# Patient Record
Sex: Male | Born: 1995 | Race: White | Hispanic: No | Marital: Married | Smoking: Current every day smoker
Health system: Southern US, Community
[De-identification: ages and names within clinical notes are randomized; demographics above are authoritative.]

## PROBLEM LIST (undated history)

## (undated) DIAGNOSIS — F909 Attention-deficit hyperactivity disorder, unspecified type: Secondary | ICD-10-CM

## (undated) DIAGNOSIS — T148XXA Other injury of unspecified body region, initial encounter: Secondary | ICD-10-CM

## (undated) HISTORY — PX: TYMPANOSTOMY: SHX2586

---
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
## 2018-05-07 ENCOUNTER — Ambulatory Visit: Payer: Self-pay | Admitting: Physician Assistant

## 2018-05-07 ENCOUNTER — Encounter: Payer: Self-pay | Admitting: Physician Assistant

## 2018-05-07 VITALS — BP 118/84 | HR 50 | Temp 97.9°F | Resp 18 | Ht 67.0 in | Wt 143.0 lb

## 2018-05-07 DIAGNOSIS — Z0000 Encounter for general adult medical examination without abnormal findings: Secondary | ICD-10-CM

## 2018-05-07 DIAGNOSIS — Z23 Encounter for immunization: Secondary | ICD-10-CM

## 2018-05-07 DIAGNOSIS — Z111 Encounter for screening for respiratory tuberculosis: Secondary | ICD-10-CM

## 2018-05-07 NOTE — Progress Notes (Addendum)
Patient ID: Shakim Kalra MRN: [id number], DOB: [birth year], 22 y.o. Date of Encounter: @DATE @  Chief Complaint:  Chief Complaint  Patient presents with  . Annual Exam  . need flu vaccine  . need Tb gold    HPI: 22 y.o. year old male  presents with above.    He reports that he is currently doing online classes and CNA classes through [employer]. 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  His grandfather was in the [employer] and names off another family member who is a Press photographer at the hospital.  Therefore he says these are contributing reasons that he has decided this is what he wants to do. He has a form with him regarding immunizations.  Immunizations are up-to-date other than getting flu vaccine which he will get today.  He also needs TB test.  I reviewed with him that he was on ADHD meds in the past.  He states that he has not been on these medications for several years.  States that he is on absolutely no medications now.  He has had no other known medical history.  No significant past history.  No other concerns to address today.    Past Medical History:  Diagnosis Date  . 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 Fracture leg, right radial     Home Meds: No outpatient medications prior to visit.   No facility-administered medications prior to visit.     Allergies: No Known Allergies  Social History   Socioeconomic History  . Marital status: Married    Spouse name: Not on file  . Number of children: Not on file  . Years of education: Not on file  . Highest education level: Not on file  Occupational History  . Not on file  Social Needs  . Financial resource strain: Not on file  . Food insecurity:    Worry: Not on file    Inability: Not on file  . Transportation needs:    Medical: Not on file    Non-medical: Not on file  Tobacco Use  . Smoking status: Current  Every Day Smoker    Types: E-cigarettes  . Smokeless tobacco: Former Engineer, water and Sexual Activity  . Alcohol use: Yes    Alcohol/week: 4.0 standard drinks    Types: 4 Cans of beer per week  . Drug use: No  . Sexual activity: Yes  Lifestyle  . Physical activity:    Days per week: Not on file    Minutes per session: Not on file  . Stress: Not on file  Relationships  . Social connections:    Talks on phone: Not on file    Gets together: Not on file    Attends religious service: Not on file    Active member of club or organization: Not on file    Attends meetings of clubs or organizations: Not on file    Relationship status: Not on file  . Intimate partner violence:    Fear of current or ex partner: Not on file    Emotionally abused: Not on file    Physically abused: Not on file    Forced sexual activity: Not on file  Other Topics Concern  . Not on file  Social History Narrative  . Not on file    History reviewed. No pertinent family history.   Review of Systems:  See HPI for pertinent ROS. All other ROS negative.    Physical Exam: Blood pressure 118/84, pulse (!) 50, temperature 97.9 F (36.6 C), temperature source Oral, resp. rate 18, height 5\' 7"  (1.702 m), weight 64.9 kg, SpO2 98 %., Body mass index is 22.4 kg/m. General:  WNWD WM Appears in no acute distress. Head: Normocephalic, atraumatic, eyes without discharge, sclera non-icteric, nares are without discharge. Bilateral auditory canals clear, TM's are without perforation, pearly grey and translucent with reflective cone of light bilaterally. Oral cavity moist, posterior pharynx without exudate, erythema.  Neck: Supple. No thyromegaly. No lymphadenopathy. Lungs: Clear bilaterally to auscultation without wheezes, rales, or rhonchi. Breathing is unlabored. Heart: RRR with S1 S2. No murmurs, rubs, or gallops. Abdomen: Soft, non-tender, non-distended with normoactive bowel sounds. No hepatomegaly. No  rebound/guarding. No obvious abdominal masses. Musculoskeletal:  Strength and tone normal for age. Extremities/Skin: Warm and dry.  No rashes or suspicious lesions. Neuro: Alert and oriented X 3. Moves all extremities spontaneously. Gait is normal. CNII-XII grossly in tact. Psych:  Responds to questions appropriately with a normal affect.     ASSESSMENT AND PLAN:  22 y.o. year old male with   1. Encounter for preventive health examination Hearing screening normal Vision screen abnormal. He reports he is aware of poor visual acuity.  Reports that his glasses have been broken and that he has not been able to afford updated eye exam, contacts/glasses.  I have discussed importance of f/u regarding this. He voices understanding. Growth Chart normal.  25th to 50th percentile for weight.  10th percentile for height. Influenza vaccine today.  Remainder of immunizations are up-to-date. Will check TB test for screening for TB.  2. Screening for tuberculosis  - QuantiFERON-TB Gold Plus   Signed, [address] [place], [place], [facility] 05/07/2018 10:57 AM

## 2018-05-10 LAB — QUANTIFERON-TB GOLD PLUS
NIL: 0.06 IU/mL
QuantiFERON-TB Gold Plus: NEGATIVE
TB1-NIL: 0 [IU]/mL
TB2-NIL: 0.01 IU/mL

## 2018-10-13 ENCOUNTER — Encounter (HOSPITAL_COMMUNITY): Payer: Self-pay

## 2018-10-13 ENCOUNTER — Ambulatory Visit (HOSPITAL_COMMUNITY)
Admission: EM | Admit: 2018-10-13 | Discharge: 2018-10-13 | Disposition: A | Discharge status: Home / Self Care | Payer: Self-pay | Attending: Family Medicine | Admitting: Family Medicine

## 2018-10-13 DIAGNOSIS — H1031 Unspecified acute conjunctivitis, right eye: Secondary | ICD-10-CM

## 2018-10-13 MED ORDER — POLYMYXIN B-TRIMETHOPRIM 10000-0.1 UNIT/ML-% OP SOLN: 1.0000 [drp] | OPHTHALMIC | 0 refills | Status: AC

## 2018-10-13 NOTE — ED Triage Notes (Signed)
Pt presents with right eye irritation, swelling, and redness since yesterday.

## 2018-10-13 NOTE — Discharge Instructions (Addendum)
We will go ahead and treat you for pick eye.  1 drop in the right eye every 4 hours while awake.  If your symptoms worsen despite treatment with more severe swelling to the eye and pain you will need to be seen again.

## 2018-10-13 NOTE — ED Provider Notes (Addendum)
[facility]    CSN: [id number] Arrival date & time: 10/13/18  1310     History   Chief Complaint Chief Complaint  Patient presents with  . Conjunctivitis    Right    HPI John Oliver is a 23 y.o. male.   Pt is a 23 year old male that presents with right eye redness, irritation, and lid swelling since yesterday. Symptoms have been constant and he has been using clear eyes with some relief in the dryness.  Some drainage.  He also has fever here today. Denies any recent sick contact. Denies any pain with the eye or light sensitivity. Denies nasal congestion, cough, rhinorrhea. Denies sinus tenderness. No injuries or foreign bodies.   ROS per HPI    Conjunctivitis     Past Medical History:  Diagnosis Date  . 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 Fracture leg, right radial    Patient Active Problem List   Diagnosis Date Noted  . ADHD (attention deficit hyperactivity disorder) 12/06/2012    Past Surgical History:  Procedure Laterality Date  . TYMPANOSTOMY         Home Medications    Prior to Admission medications   Medication Sig Start Date End Date Taking? Authorizing Provider  trimethoprim-polymyxin b (POLYTRIM) ophthalmic solution Place 1 drop into the right eye every 4 (four) hours for 7 days. 10/13/18 10/20/18  Janace Aris, NP    Family History Family History  Problem Relation Age of Onset  . Healthy Mother   . Healthy Father     Social History Social History   Tobacco Use  . Smoking status: Current Every Day Smoker    Types: E-cigarettes  . Smokeless tobacco: Former Engineer, water Use Topics  . Alcohol use: Yes    Alcohol/week: 4.0 standard drinks    Types: 4 Cans of beer per week  . Drug use: No     Allergies   Patient has no known allergies.   Review of Systems Review of Systems   Physical Exam Triage Vital Signs ED Triage Vitals  Enc Vitals Group     BP 10/13/18 1346 132/65     Pulse Rate  10/13/18 1346 (!) 55     Resp --      Temp 10/13/18 1346 99.9 F (37.7 C)     Temp Source 10/13/18 1346 Oral     SpO2 10/13/18 1346 100 %     Weight --      Height --      Head Circumference --      Peak Flow --      Pain Score 10/13/18 1347 2     Pain Loc --      Pain Edu? --      Excl. in GC? --    No data found.  Updated Vital Signs BP 132/65 (BP Location: Right Arm)   Pulse (!) 55   Temp 99.9 F (37.7 C) (Oral)   SpO2 100%   Visual Acuity Right Eye Distance:   Left Eye Distance:   Bilateral Distance:    Right Eye Near:   Left Eye Near:    Bilateral Near:     Physical Exam Constitutional:      General: He is not in acute distress.    Appearance: Normal appearance. He is not ill-appearing, toxic-appearing or diaphoretic.  HENT:     Head: Normocephalic and atraumatic.     Right Ear: Tympanic membrane and ear canal  normal.     Left Ear: Tympanic membrane and ear canal normal.     Nose: Nose normal.     Mouth/Throat:     Pharynx: Oropharynx is clear.  Eyes:     General:        Right eye: Discharge present.     Extraocular Movements: Extraocular movements intact.     Conjunctiva/sclera:     Right eye: Right conjunctiva is injected.     Comments: Mild upper and lower lid swelling.   Neurological:     Mental Status: He is alert.      UC Treatments / Results  Labs (all labs ordered are listed, but only abnormal results are displayed) Labs Reviewed - No data to display  EKG None  Radiology No results found.  Procedures Procedures (including critical care time)  Medications Ordered in UC Medications - No data to display  Initial Impression / Assessment and Plan / UC Course  I have reviewed the triage vital signs and the nursing notes.  Pertinent labs & imaging results that were available during my care of the patient were reviewed by me and considered in my medical decision making (see chart for details).     Symptoms consistent with  conjunctivitis Will treat with Polytrim Instructed to follow up for continued or worsening problems.  Final Clinical Impressions(s) / UC Diagnoses   Final diagnoses:  Acute bacterial conjunctivitis of right eye     Discharge Instructions     We will go ahead and treat you for pick eye.  1 drop in the right eye every 4 hours while awake.  If your symptoms worsen despite treatment with more severe swelling to the eye and pain you will need to be seen again.     ED Prescriptions    Medication Sig Dispense Auth. Provider   trimethoprim-polymyxin b (POLYTRIM) ophthalmic solution Place 1 drop into the right eye every 4 (four) hours for 7 days. 10 mL Dahlia Byes A, NP     Controlled Substance Prescriptions [place] Controlled Substance Registry consulted? Not Applicable       Janace Aris, NP 10/13/18 1420

## 2018-10-20 ENCOUNTER — Ambulatory Visit (HOSPITAL_COMMUNITY)
Admission: EM | Admit: 2018-10-20 | Discharge: 2018-10-20 | Disposition: A | Discharge status: Home / Self Care | Payer: Self-pay | Attending: Family Medicine | Admitting: Family Medicine

## 2018-10-20 ENCOUNTER — Encounter (HOSPITAL_COMMUNITY): Payer: Self-pay

## 2018-10-20 DIAGNOSIS — H1033 Unspecified acute conjunctivitis, bilateral: Secondary | ICD-10-CM

## 2018-10-20 MED ORDER — PREDNISOLONE ACETATE 1 % OP SUSP: 1.0000 [drp] | OPHTHALMIC | 0 refills | Status: AC

## 2018-10-20 NOTE — Discharge Instructions (Signed)
Use the new eye drops for a few days See eye doctor if this does not resolve

## 2018-10-20 NOTE — ED Triage Notes (Signed)
Pt presents to have his right eye rechecked and get cleared to go back to work, pt had pink eye.

## 2018-10-20 NOTE — ED Provider Notes (Signed)
[facility]    CSN: [id number] Arrival date & time: 10/20/18  1025     History   Chief Complaint Chief Complaint  Patient presents with  . Eye Recheck    HPI John Oliver is a 23 y.o. male.   HPI  Here for conjunctivitis on 10/13/2018.  Treated with Polytrim ophthalmic solution.  He states is been using it for the last 3 days or so.  He states he has used it inconsistently.  He is here today because he states he needs a work for an hour in order to go back to work.  His eyes are still quite red.  Somewhat itchy.  No drainage or discharge, no photophobia, no tearing  Past Medical History:  Diagnosis Date  . 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 Fracture leg, right radial    Patient Active Problem List   Diagnosis Date Noted  . ADHD (attention deficit hyperactivity disorder) 12/06/2012    Past Surgical History:  Procedure Laterality Date  . TYMPANOSTOMY         Home Medications    Prior to Admission medications   Medication Sig Start Date End Date Taking? Authorizing Provider  prednisoLONE acetate (PRED FORTE) 1 % ophthalmic suspension Place 1 drop into both eyes every 4 (four) hours. Use no more than 5 days 10/20/18   Eustace Moore, MD  trimethoprim-polymyxin b [facility]) ophthalmic solution Place 1 drop into the right eye every 4 (four) hours for 7 days. 10/13/18 10/20/18  Janace Aris, NP    Family History Family History  Problem Relation Age of Onset  . Healthy Mother   . Healthy Father     Social History Social History   Tobacco Use  . Smoking status: Current Every Day Smoker    Types: E-cigarettes  . Smokeless tobacco: Former Engineer, water Use Topics  . Alcohol use: Yes    Alcohol/week: 4.0 standard drinks    Types: 4 Cans of beer per week  . Drug use: No     Allergies   Patient has no known allergies.   Review of Systems Review of Systems  Constitutional: Negative for chills and fever.  HENT:  Negative for ear pain and sore throat.   Eyes: Positive for redness. Negative for pain and visual disturbance.  Respiratory: Negative for cough and shortness of breath.   Cardiovascular: Negative for chest pain and palpitations.  Gastrointestinal: Negative for abdominal pain and vomiting.  Genitourinary: Negative for dysuria and hematuria.  Musculoskeletal: Negative for arthralgias and back pain.  Skin: Negative for color change and rash.  Neurological: Negative for seizures and syncope.  All other systems reviewed and are negative.    Physical Exam Triage Vital Signs ED Triage Vitals [10/20/18 0925]  Enc Vitals Group     BP 128/75     Pulse Rate 64     Resp 18     Temp (!) 97.4 F (36.3 C)     Temp Source Oral     SpO2 96 %     Weight      Height      Head Circumference      Peak Flow      Pain Score 0     Pain Loc      Pain Edu?      Excl. in GC?    No data found.  Updated Vital Signs BP 128/75 (BP Location: Left Arm)   Pulse 64   Temp Marland Kitchen)  97.4 F (36.3 C) (Oral)   Resp 18   SpO2 96%   Visual Acuity Right Eye Distance:   Left Eye Distance:   Bilateral Distance:    Right Eye Near:   Left Eye Near:    Bilateral Near:     Physical Exam Constitutional:      General: He is not in acute distress.    Appearance: He is well-developed.  HENT:     Head: Normocephalic and atraumatic.  Eyes:     Conjunctiva/sclera: Conjunctivae normal.     Pupils: Pupils are equal, round, and reactive to light.     Comments: Both eyes with conjunctival injection.  The lower lid has cobblestoning and is deeply erythematous.  No discharge seen  Neck:     Musculoskeletal: Normal range of motion.  Cardiovascular:     Rate and Rhythm: Normal rate.  Pulmonary:     Effort: Pulmonary effort is normal. No respiratory distress.  Abdominal:     General: There is no distension.     Palpations: Abdomen is soft.  Musculoskeletal: Normal range of motion.  Skin:    General: Skin is warm  and dry.  Neurological:     Mental Status: He is alert.      UC Treatments / Results  Labs (all labs ordered are listed, but only abnormal results are displayed) Labs Reviewed - No data to display  EKG None  Radiology No results found.  Procedures Procedures (including critical care time)  Medications Ordered in UC Medications - No data to display  Initial Impression / Assessment and Plan / UC Course  I have reviewed the triage vital signs and the nursing notes.  Pertinent labs & imaging results that were available during my care of the patient were reviewed by me and considered in my medical decision making (see chart for details).     The patient's eyes are still quite red, he is minimally symptomatic.  This may just be a viral conjunctivitis that has not yet healed.  Will give him a few days of prednisone to see whether this helps take down some eye irritation.  He understands if this does not give him complete improvement in a couple days he needs to see an eye specialist. Final Clinical Impressions(s) / UC Diagnoses   Final diagnoses:  Acute conjunctivitis of both eyes, unspecified acute conjunctivitis type     Discharge Instructions     Use the new eye drops for a few days See eye doctor if this does not resolve   ED Prescriptions    Medication Sig Dispense Auth. Provider   prednisoLONE acetate (PRED FORTE) 1 % ophthalmic suspension Place 1 drop into both eyes every 4 (four) hours. Use no more than 5 days 5 mL Eustace Moore, MD     Controlled Substance Prescriptions [place] Controlled Substance Registry consulted? Not Applicable   Eustace Moore, MD 10/20/18 1001

## 2021-09-10 ENCOUNTER — Emergency Department (HOSPITAL_BASED_OUTPATIENT_CLINIC_OR_DEPARTMENT_OTHER)
Admission: EM | Admit: 2021-09-10 | Discharge: 2021-09-10 | Disposition: A | Discharge status: Home / Self Care | Payer: Commercial Managed Care - PPO | Attending: Emergency Medicine | Admitting: Emergency Medicine

## 2021-09-10 ENCOUNTER — Encounter (HOSPITAL_BASED_OUTPATIENT_CLINIC_OR_DEPARTMENT_OTHER): Payer: Self-pay | Admitting: Obstetrics and Gynecology

## 2021-09-10 ENCOUNTER — Emergency Department (HOSPITAL_BASED_OUTPATIENT_CLINIC_OR_DEPARTMENT_OTHER): Payer: Commercial Managed Care - PPO

## 2021-09-10 ENCOUNTER — Other Ambulatory Visit: Payer: Self-pay

## 2021-09-10 DIAGNOSIS — I10 Essential (primary) hypertension: Secondary | ICD-10-CM | POA: Insufficient documentation

## 2021-09-10 DIAGNOSIS — R509 Fever, unspecified: Secondary | ICD-10-CM | POA: Insufficient documentation

## 2021-09-10 DIAGNOSIS — Z20822 Contact with and (suspected) exposure to covid-19: Secondary | ICD-10-CM | POA: Insufficient documentation

## 2021-09-10 DIAGNOSIS — R0981 Nasal congestion: Secondary | ICD-10-CM | POA: Diagnosis not present

## 2021-09-10 DIAGNOSIS — R059 Cough, unspecified: Secondary | ICD-10-CM | POA: Diagnosis not present

## 2021-09-10 LAB — RESP PANEL BY RT-PCR (FLU A&B, COVID) ARPGX2
Influenza A by PCR: NEGATIVE
Influenza B by PCR: NEGATIVE
SARS Coronavirus 2 by RT PCR: NEGATIVE

## 2021-09-10 MED ORDER — ALBUTEROL SULFATE HFA 108 (90 BASE) MCG/ACT IN AERS
1.0000 | INHALATION_SPRAY | Freq: Once | RESPIRATORY_TRACT | Status: AC
  Administered 2021-09-10: 1 via RESPIRATORY_TRACT
  Filled 2021-09-10: qty 6.7

## 2021-09-10 MED ORDER — FLUTICASONE PROPIONATE 50 MCG/ACT NA SUSP: 1.0000 | Freq: Every day | NASAL | 2 refills | Status: AC

## 2021-09-10 NOTE — ED Provider Notes (Signed)
[facility] EMERGENCY DEPT Provider Note   CSN: [id number] Arrival date & time: 09/10/21  1813     History Chief Complaint  Patient presents with   Cough    John Oliver is a 26 y.o. male presents to the emergency department for evaluation of cough, nasal congestion, sinus pressure, subjective fever/chills for the past 3 days.  Denies any sore throat, body aches, nausea, vomiting, diarrhea, abdominal pain, lightheadedness, syncope, chest pain, shortness of breath.  Patient has tried Mucinex and Robitussin-DM without much relief of symptoms.  No pertinent medical or surgical history.  No daily medications.  No known drug allergies.  Daily marijuana and tobacco smoker.   Cough Associated symptoms: chills and fever   Associated symptoms: no chest pain, no myalgias, no rhinorrhea, no shortness of breath and no sore throat       Home Medications Prior to Admission medications   Medication Sig Start Date End Date Taking? Authorizing Provider  fluticasone (FLONASE) 50 MCG/ACT nasal spray Place 1 spray into both nostrils daily. 09/10/21  Yes Sherrell Puller, PA-C  prednisoLONE acetate (PRED FORTE) 1 % ophthalmic suspension Place 1 drop into both eyes every 4 (four) hours. Use no more than 5 days 10/20/18   Raylene Everts, MD      Allergies    Patient has no known allergies.    Review of Systems   Review of Systems  Constitutional:  Positive for chills and fever.  HENT:  Positive for congestion and sinus pain. Negative for rhinorrhea and sore throat.   Respiratory:  Positive for cough. Negative for shortness of breath.   Cardiovascular:  Negative for chest pain.  Gastrointestinal:  Negative for abdominal pain, nausea and vomiting.  Musculoskeletal:  Negative for myalgias.  Neurological:  Negative for syncope, weakness and light-headedness.   Physical Exam Updated Vital Signs BP (!) 153/102    Pulse 76    Temp 98.8 F (37.1 C) (Oral)    Resp 17    Ht 5\' 9"  (1.753 m)     Wt 68.9 kg    SpO2 97%    BMI 22.45 kg/m  Physical Exam Vitals and nursing note reviewed.  Constitutional:      General: He is not in acute distress.    Appearance: He is not toxic-appearing.  HENT:     Head: Normocephalic and atraumatic.     Right Ear: Tympanic membrane, ear canal and external ear normal.     Left Ear: Tympanic membrane, ear canal and external ear normal.     Nose:     Comments: Bilateral nasal turbinate edema and erythema with scant clear nasal discharge.    Mouth/Throat:     Mouth: Mucous membranes are moist.     Comments: No pharyngeal erythema, exudate, or edema noted.  Uvula midline.  Airway patent.  Moist mucous membranes. Eyes:     General: No scleral icterus.    Conjunctiva/sclera: Conjunctivae normal.  Cardiovascular:     Rate and Rhythm: Normal rate.  Pulmonary:     Effort: Pulmonary effort is normal. No respiratory distress.  Abdominal:     General: Abdomen is flat.     Palpations: Abdomen is soft.  Musculoskeletal:        General: No deformity.     Cervical back: Normal range of motion.  Skin:    Findings: No rash.  Neurological:     General: No focal deficit present.     Mental Status: He is alert.    ED  Results / Procedures / Treatments   Labs (all labs ordered are listed, but only abnormal results are displayed) Labs Reviewed  RESP PANEL BY RT-PCR (FLU A&B, COVID) [id number]    EKG None  Radiology DG Chest Port 1 View  Result Date: 09/10/2021 CLINICAL DATA:  Four days of cough, fever, chills EXAM: PORTABLE CHEST 1 VIEW COMPARISON:  None. FINDINGS: The heart and mediastinal contours are within normal limits. No focal consolidation. No pulmonary edema. No pleural effusion. No pneumothorax. No acute osseous abnormality. IMPRESSION: No active disease. Electronically Signed   By: Iven Finn M.D.   On: 09/10/2021 18:50    Procedures Procedures   Medications Ordered in ED Medications  albuterol (VENTOLIN HFA) 108 (90 Base) MCG/ACT  inhaler 1 puff (1 puff Inhalation Given 09/10/21 1956)    ED Course/ Medical Decision Making/ A&P                           Medical Decision Making Amount and/or Complexity of Data Reviewed Radiology: ordered.  Risk Prescription drug management.  26 year old male presents emergency department for evaluation of cough and cold symptoms the past 3 days.  Differential diagnosis includes is not limited to viral illness, COVID, flu, pneumonia.  Vital signs unremarkable other than some mild hypertension.  Afebrile.  Normal heart rate.  Patient satting 99% on room air.  Physical exam shows some bilateral nasal turbinate erythema and edema with scant clear nasal discharge.  No pharyngeal erythema.  Lungs are clear to auscultation bilaterally.  Will swab for COVID and flu.  I independently interpreted and reviewed the patient's lab and imaging.  COVID and flu negative.  Chest x-ray shows no acute cardiopulmonary process.  No pneumonia or pneumothorax visualized.  This is likely a viral illness given the short duration of symptoms so far.  Recommended patient continue with his over-the-counter cough and cold medication use and gave albuterol inhaler to help with cough. Additionally, I prescribed Flonase. Patient declined work note.  Return precautions discussed.  Patient agrees to plan.  Patient is stable being discharged home in good condition.   Final Clinical Impression(s) / ED Diagnoses Final diagnoses:  Cough    Rx / DC Orders ED Discharge Orders          Ordered    fluticasone (FLONASE) 50 MCG/ACT nasal spray  Daily        09/10/21 1939              Sherrell Puller, [place] 09/12/21 2350    Wyvonnia Dusky, MD 09/13/21 1018

## 2021-09-10 NOTE — Discharge Instructions (Addendum)
You were seen here today for evaluation of your cough and cold symptoms. You tested negative for COVID and the flu. This is likely a viral illness. I prescribed you Flonase which is an intranasal spray that you should use daily to relieve nasal congestion.  Additionally, you are given an albuterol inhaler while here.  You can use this as needed.  You have an elevated blood pressure he should follow-up with her primary care provider.  1 has been attached to this chart.  Please call to schedule an appointment.  If you have any chest pain, shortness of breath, coughing up blood, nausea, vomiting, please return to the nearest emergency room for evaluation.

## 2021-09-10 NOTE — ED Triage Notes (Signed)
Patient reports x4 days cough, fevers, and chills.

## 2023-03-06 IMAGING — DX DG CHEST 1V PORT
1 series · 1 of 1 positions shown · non-contrast
Comparison: None.

CLINICAL DATA: Four days of cough, fever, chills

EXAM:
PORTABLE CHEST 1 VIEW

[chest]
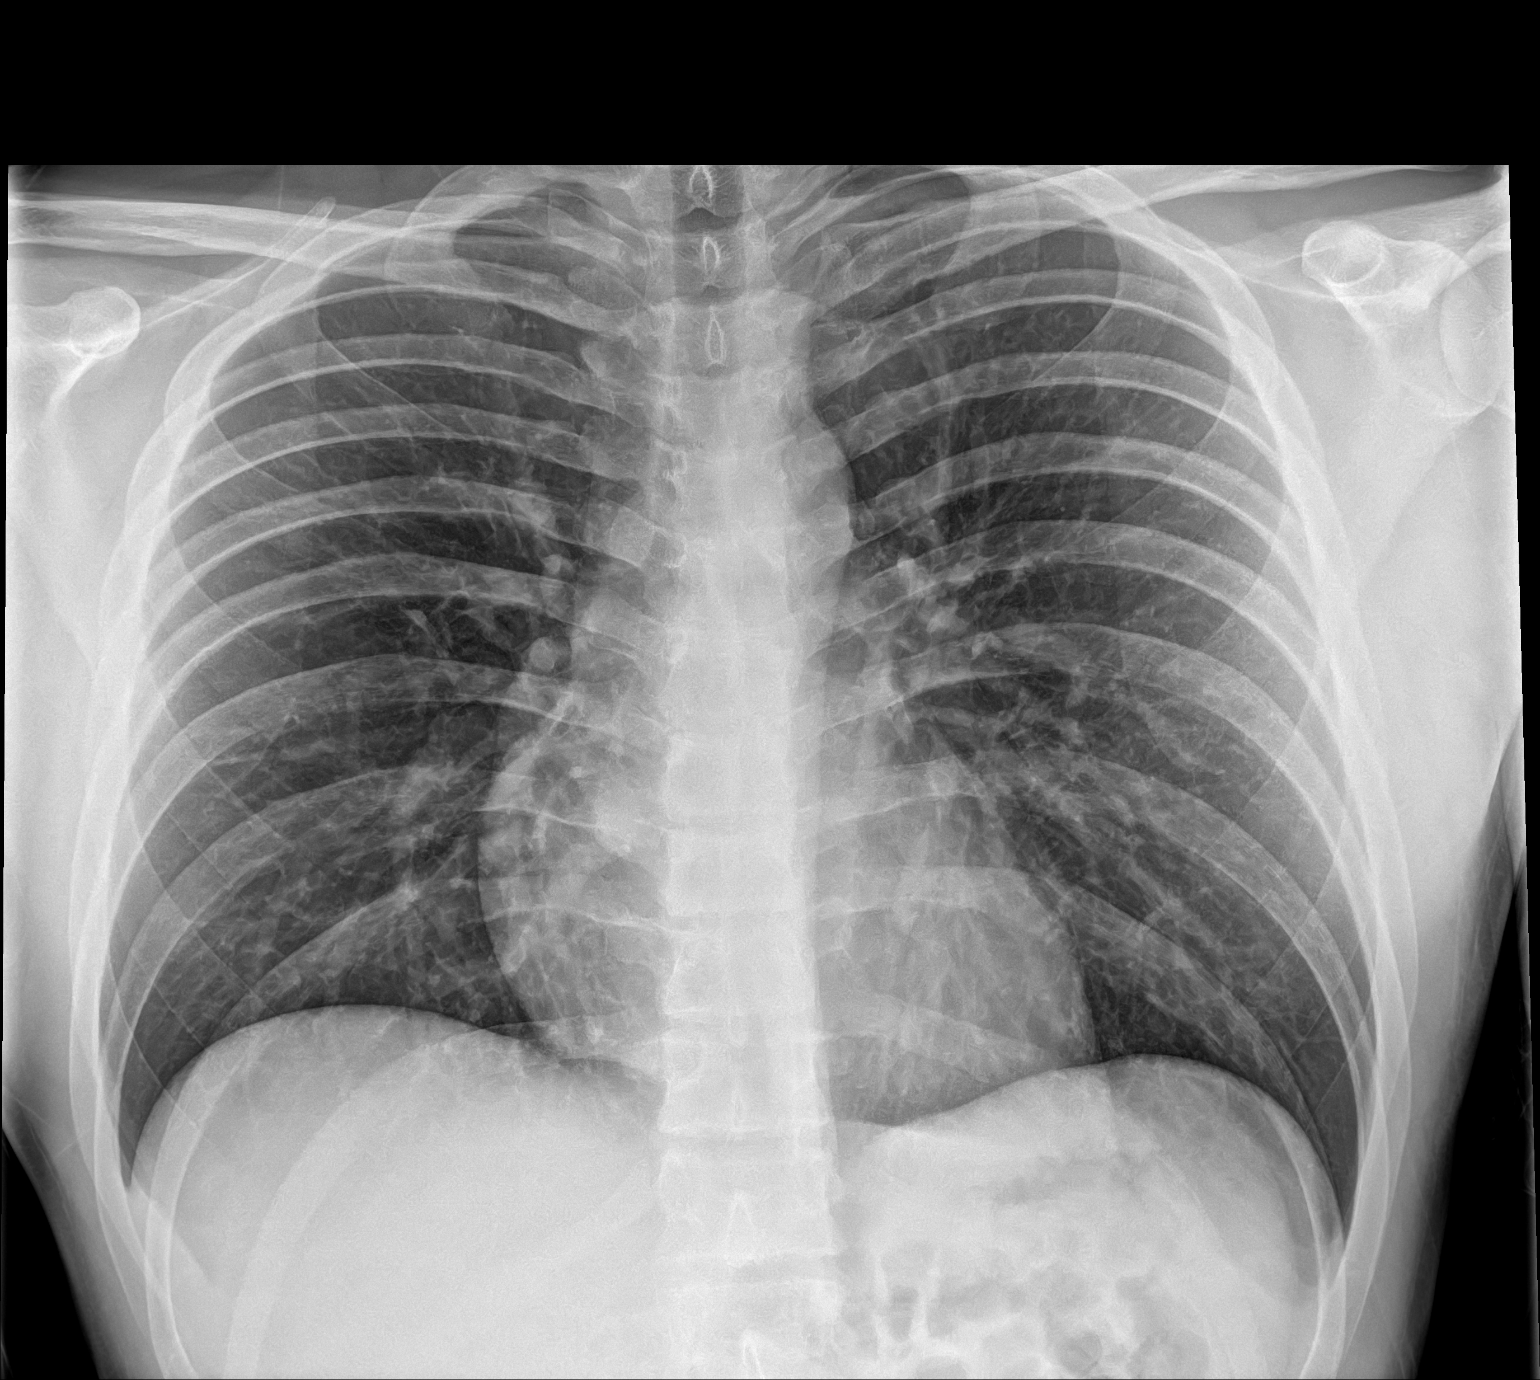

[1 of 1 positions shown; findings below may reference images not displayed]

FINDINGS: The heart and mediastinal contours are within normal limits.

No focal consolidation. No pulmonary edema. No pleural effusion. No
pneumothorax.

No acute osseous abnormality.
IMPRESSION: No active disease.

## 2024-08-27 ENCOUNTER — Emergency Department (HOSPITAL_BASED_OUTPATIENT_CLINIC_OR_DEPARTMENT_OTHER)

## 2024-08-27 ENCOUNTER — Emergency Department (HOSPITAL_BASED_OUTPATIENT_CLINIC_OR_DEPARTMENT_OTHER): Admission: EM | Admit: 2024-08-27 | Discharge: 2024-08-27 | Disposition: A | Discharge status: Home / Self Care

## 2024-08-27 ENCOUNTER — Other Ambulatory Visit: Payer: Self-pay

## 2024-08-27 ENCOUNTER — Encounter (HOSPITAL_BASED_OUTPATIENT_CLINIC_OR_DEPARTMENT_OTHER): Payer: Self-pay

## 2024-08-27 DIAGNOSIS — Y9323 Activity, snow (alpine) (downhill) skiing, snow boarding, sledding, tobogganing and snow tubing: Secondary | ICD-10-CM | POA: Diagnosis not present

## 2024-08-27 DIAGNOSIS — S3011XA Contusion of abdominal wall, initial encounter: Secondary | ICD-10-CM | POA: Insufficient documentation

## 2024-08-27 DIAGNOSIS — S3991XA Unspecified injury of abdomen, initial encounter: Secondary | ICD-10-CM | POA: Diagnosis present

## 2024-08-27 LAB — URINALYSIS, ROUTINE W REFLEX MICROSCOPIC
Glucose, UA: NEGATIVE mg/dL
Hgb urine dipstick: NEGATIVE
Ketones, ur: 15 mg/dL — AB
Leukocytes,Ua: NEGATIVE
Nitrite: NEGATIVE
Protein, ur: NEGATIVE mg/dL
Specific Gravity, Urine: >1.03 — ABNORMAL HIGH (ref 1.005–1.030)
pH: 6 (ref 5.0–8.0)

## 2024-08-27 LAB — URINALYSIS, MICROSCOPIC (REFLEX): Bacteria, UA: NONE SEEN

## 2024-08-27 LAB — COMPREHENSIVE METABOLIC PANEL WITH GFR
ALT: 38 U/L (ref 0–44)
AST: 49 U/L — ABNORMAL HIGH (ref 15–41)
Albumin: 4.8 g/dL (ref 3.5–5.0)
Alkaline Phosphatase: 67 U/L (ref 38–126)
Anion gap: 16 — ABNORMAL HIGH (ref 5–15)
BUN: 12 mg/dL (ref 6–20)
CO2: 21 mmol/L — ABNORMAL LOW (ref 22–32)
Calcium: 9.5 mg/dL (ref 8.9–10.3)
Chloride: 103 mmol/L (ref 98–111)
Creatinine, Ser: 0.93 mg/dL (ref 0.61–1.24)
GFR, Estimated: >60 mL/min
Glucose, Bld: 90 mg/dL (ref 70–99)
Potassium: 3.4 mmol/L — ABNORMAL LOW (ref 3.5–5.1)
Sodium: 139 mmol/L (ref 135–145)
Total Bilirubin: 0.6 mg/dL (ref 0.0–1.2)
Total Protein: 7.3 g/dL (ref 6.5–8.1)

## 2024-08-27 LAB — CBC
HCT: 42.4 % (ref 39.0–52.0)
Hemoglobin: 15.1 g/dL (ref 13.0–17.0)
MCH: 32.6 pg (ref 26.0–34.0)
MCHC: 35.6 g/dL (ref 30.0–36.0)
MCV: 91.6 fL (ref 80.0–100.0)
Platelets: 220 K/uL (ref 150–400)
RBC: 4.63 MIL/uL (ref 4.22–5.81)
RDW: 12.5 % (ref 11.5–15.5)
WBC: 8.8 K/uL (ref 4.0–10.5)
nRBC: 0 % (ref 0.0–0.2)

## 2024-08-27 LAB — LIPASE, BLOOD: Lipase: 39 U/L (ref 11–51)

## 2024-08-27 MED ORDER — IOHEXOL 300 MG/ML  SOLN
100.0000 mL | Freq: Once | INTRAMUSCULAR | Status: AC | PRN
  Administered 2024-08-27: 100 mL via INTRAVENOUS

## 2024-08-27 NOTE — Discharge Instructions (Signed)
 You CT is negative for both internal injury from the fall 2 days ago and for appendicitis. Your pain is likely musculoskeletal in nature. Recommend ibuprofen  or tylenol  for discomfort. REturn to the ED with any fever, increasing pain or new concern.

## 2024-08-27 NOTE — ED Triage Notes (Signed)
 Patient reports abdominal pain in his right lower quadrant. He noticed more pain this morning. He also said he had been snowboarding and did take some hard falls but unsure if related. [place] it feels like a pressure and painful with cough. Pain with palpation but denies rebound tenderness.

## 2024-08-27 NOTE — ED Notes (Signed)
 Reviewed discharge instructions and home care with pt. Pt verbalized understanding and had no further questions. Pt exited ED without complications.

## 2024-08-27 NOTE — ED Provider Notes (Signed)
 " [facility] AT [facility] Provider Note   CSN: [id number] Arrival date & time: 08/27/24  1719     Patient presents with: Abdominal Pain   John Oliver is a 29 y.o. male.   Patient to ED with pain in the RLQ abdomen x 2 days. He reports he fell while snowboarding and hit a rail in that area. Since he has had pain. No nausea or vomiting. No fever. He is urinating well. He reports he had a bowel movement yesterday that was loose but he was drinking the night before and felt that was the cause. No bloody stools. He reports straining for a bowel movement, as well as coughing causes an increase to the pain. No fever. No radiation of the pain to the groin.  The history is provided by the patient. No language interpreter was used.  Abdominal Pain      Prior to Admission medications  Medication Sig Start Date End Date Taking? Authorizing Provider  fluticasone  (FLONASE ) 50 MCG/ACT nasal spray Place 1 spray into both nostrils daily. 09/10/21   Bernis Ernst, PA-C  prednisoLONE  acetate (PRED FORTE ) 1 % ophthalmic suspension Place 1 drop into both eyes every 4 (four) hours. Use no more than 5 days 10/20/18   Maranda Jamee Jacob, MD    Allergies: Patient has no known allergies.    Review of Systems  Gastrointestinal:  Positive for abdominal pain.    Updated Vital Signs BP 131/85   Pulse (!) 50   Temp 98.2 F (36.8 C) (Oral)   Resp 16   SpO2 98%   Physical Exam Vitals and nursing note reviewed.  Constitutional:      Appearance: He is well-developed.  Pulmonary:     Effort: Pulmonary effort is normal.  Abdominal:     General: There is no distension.     Palpations: Abdomen is soft.     Tenderness: There is abdominal tenderness in the right lower quadrant. There is no right CVA tenderness, guarding or rebound.     Hernia: No hernia is present.  Genitourinary:    Penis: Normal.      Testes: Normal.        Right: Tenderness not present.        Left:  Tenderness not present.  Musculoskeletal:        General: Normal range of motion.     Cervical back: Normal range of motion.  Skin:    General: Skin is warm and dry.  Neurological:     Mental Status: He is alert and oriented to person, place, and time.     (all labs ordered are listed, but only abnormal results are displayed) Labs Reviewed  COMPREHENSIVE METABOLIC PANEL WITH GFR - Abnormal; Notable for the following components:      Result Value   Potassium 3.4 (*)    CO2 21 (*)    AST 49 (*)    Anion gap 16 (*)    All other components within normal limits  URINALYSIS, ROUTINE W REFLEX MICROSCOPIC - Abnormal; Notable for the following components:   Specific Gravity, Urine >1.030 (*)    Bilirubin Urine SMALL (*)    Ketones, ur 15 (*)    All other components within normal limits  LIPASE, BLOOD  CBC  URINALYSIS, MICROSCOPIC (REFLEX)    EKG: None  Radiology: CT ABDOMEN PELVIS W CONTRAST Result Date: 08/27/2024 EXAM: CT ABDOMEN AND PELVIS WITH CONTRAST 08/27/2024 07:58:47 PM TECHNIQUE: CT of the abdomen and pelvis was  performed with the administration of 100 mL of iohexol  (OMNIPAQUE ) 300 MG/ML solution. Multiplanar reformatted images are provided for review. Automated exposure control, iterative reconstruction, and/or weight-based adjustment of the mA/kV was utilized to reduce the radiation dose to as low as reasonably achievable. COMPARISON: None available. CLINICAL HISTORY: RLQ abdominal pain. FINDINGS: LOWER CHEST: No acute abnormality. LIVER: The liver is unremarkable. GALLBLADDER AND BILE DUCTS: Gallbladder is unremarkable. No biliary ductal dilatation. SPLEEN: No acute abnormality. PANCREAS: No acute abnormality. ADRENAL GLANDS: No acute abnormality. KIDNEYS, URETERS AND BLADDER: No stones in the kidneys or ureters. No hydronephrosis. No perinephric or periureteral stranding. Urinary bladder is unremarkable. GI AND BOWEL: Stomach demonstrates no acute abnormality. There is no bowel  obstruction. There are scattered colonic diverticula. APPENDIX: The appendix is normal. PERITONEUM AND RETROPERITONEUM: No ascites. No free air. VASCULATURE: Aorta is normal in caliber. LYMPH NODES: No lymphadenopathy. REPRODUCTIVE ORGANS: No acute abnormality. BONES AND SOFT TISSUES: No acute osseous abnormality. No focal soft tissue abnormality. IMPRESSION: 1. No acute findings in the abdomen or pelvis. 2. Scattered colonic diverticula without evidence of diverticulitis. Electronically signed by: Greig Pique MD 08/27/2024 08:19 PM EST RP Workstation: [id number]     Procedures   Medications Ordered in the ED  iohexol  (OMNIPAQUE ) 300 MG/ML solution 100 mL (100 mLs Intravenous Contrast Given 08/27/24 1958)    Clinical Course as of 08/27/24 2046  Thu Aug 27, 2024  1932 Patient with RLQ abdominal pain since fall with impact of a metal rail to the sore area 2 days ago. No vomiting. No bloody stools. No fever.   Labs overall reassuring. No leukocytosis. There is tenderness to the area without guarding. With recent trauma and persistent pain, will get CT. Urine collected for review as well.  [SU]  2035 CT abd/pel does not show any internal injury from fall 2 days ago. Normal appendix. VSS. No testicular tenderness on exam to suggest torsion. He is stable for discharge.  [SU]    Clinical Course User Index [SU] Odell Balls, PA-C                                 Medical Decision Making Amount and/or Complexity of Data Reviewed Labs: ordered. Radiology: ordered.  Risk Prescription drug management.        Final diagnoses:  Contusion of abdominal wall, initial encounter    ED Discharge Orders     None          Odell Balls, [place] 08/27/24 2046    Gennaro Bouchard L, DO 08/30/24 1359  "
